# Patient Record
Sex: Male | Born: 1990 | Race: White | Hispanic: No | Marital: Married | Smoking: Never smoker
Health system: Southern US, Community
[De-identification: ages and names within clinical notes are randomized; demographics above are authoritative.]

## PROBLEM LIST (undated history)

## (undated) DIAGNOSIS — J45909 Unspecified asthma, uncomplicated: Secondary | ICD-10-CM

## (undated) DIAGNOSIS — K219 Gastro-esophageal reflux disease without esophagitis: Secondary | ICD-10-CM

## (undated) HISTORY — DX: Gastro-esophageal reflux disease without esophagitis: K21.9

## (undated) HISTORY — DX: Unspecified asthma, uncomplicated: J45.909

---

## 2019-04-28 ENCOUNTER — Ambulatory Visit (INDEPENDENT_AMBULATORY_CARE_PROVIDER_SITE_OTHER): Payer: 59

## 2019-04-28 ENCOUNTER — Other Ambulatory Visit: Payer: Self-pay

## 2019-04-28 ENCOUNTER — Ambulatory Visit: Payer: 59 | Admitting: Family Medicine

## 2019-04-28 ENCOUNTER — Encounter: Payer: Self-pay | Admitting: Family Medicine

## 2019-04-28 VITALS — BP 116/69 | HR 68 | Temp 98.7°F | Resp 16 | Wt 133.2 lb

## 2019-04-28 DIAGNOSIS — Z13220 Encounter for screening for lipoid disorders: Secondary | ICD-10-CM | POA: Diagnosis not present

## 2019-04-28 DIAGNOSIS — M25521 Pain in right elbow: Secondary | ICD-10-CM

## 2019-04-28 DIAGNOSIS — Z131 Encounter for screening for diabetes mellitus: Secondary | ICD-10-CM | POA: Diagnosis not present

## 2019-04-28 DIAGNOSIS — Z114 Encounter for screening for human immunodeficiency virus [HIV]: Secondary | ICD-10-CM

## 2019-04-28 DIAGNOSIS — Z1329 Encounter for screening for other suspected endocrine disorder: Secondary | ICD-10-CM

## 2019-04-28 DIAGNOSIS — R12 Heartburn: Secondary | ICD-10-CM

## 2019-04-28 DIAGNOSIS — Z130 Encounter for screening for diseases of the blood and blood-forming organs and certain disorders involving the immune mechanism: Secondary | ICD-10-CM

## 2019-04-28 DIAGNOSIS — S5001XA Contusion of right elbow, initial encounter: Secondary | ICD-10-CM | POA: Diagnosis not present

## 2019-04-28 NOTE — Patient Instructions (Addendum)
Try to avoid trigger foods for heartburn.  If daily meds needed, follow up to discuss further.   Good news.  There is no sign of fracture or concerns on x-ray of your elbow.  I suspect it was a contusion and should continue to improve.  If any worsening symptoms return for recheck.  I did check some screening labs as we discussed and if there are abnormalities can follow-up to discuss further.  Thank you for coming in today and let me know if there are questions.      Food Choices for Gastroesophageal Reflux Disease, Adult When you have gastroesophageal reflux disease (GERD), the foods you eat and your eating habits are very important. Choosing the right foods can help ease your discomfort. Think about working with a nutrition specialist (dietitian) to help you make good choices. What are tips for following this plan?  Meals  Choose healthy foods that are low in fat, such as fruits, vegetables, whole grains, low-fat dairy products, and lean meat, fish, and poultry.  Eat small meals often instead of 3 large meals a day. Eat your meals slowly, and in a place where you are relaxed. Avoid bending over or lying down until 2-3 hours after eating.  Avoid eating meals 2-3 hours before bed.  Avoid drinking a lot of liquid with meals.  Cook foods using methods other than frying. Bake, grill, or broil food instead.  Avoid or limit: ? Chocolate. ? Peppermint or spearmint. ? Alcohol. ? Pepper. ? Black and decaffeinated coffee. ? Black and decaffeinated tea. ? Bubbly (carbonated) soft drinks. ? Caffeinated energy drinks and soft drinks.  Limit high-fat foods such as: ? Fatty meat or fried foods. ? Whole milk, cream, butter, or ice cream. ? Nuts and nut butters. ? Pastries, donuts, and sweets made with butter or shortening.  Avoid foods that cause symptoms. These foods may be different for everyone. Common foods that cause symptoms include: ? Tomatoes. ? Oranges, lemons, and  limes. ? Peppers. ? Spicy food. ? Onions and garlic. ? Vinegar. Lifestyle  Maintain a healthy weight. Ask your doctor what weight is healthy for you. If you need to lose weight, work with your doctor to do so safely.  Exercise for at least 30 minutes for 5 or more days each week, or as told by your doctor.  Wear loose-fitting clothes.  Do not smoke. If you need help quitting, ask your doctor.  Sleep with the head of your bed higher than your feet. Use a wedge under the mattress or blocks under the bed frame to raise the head of the bed. Summary  When you have gastroesophageal reflux disease (GERD), food and lifestyle choices are very important in easing your symptoms.  Eat small meals often instead of 3 large meals a day. Eat your meals slowly, and in a place where you are relaxed.  Limit high-fat foods such as fatty meat or fried foods.  Avoid bending over or lying down until 2-3 hours after eating.  Avoid peppermint and spearmint, caffeine, alcohol, and chocolate. This information is not intended to replace advice given to you by your health care provider. Make sure you discuss any questions you have with your health care provider. Document Released: 02/25/2012 Document Revised: 12/17/2018 Document Reviewed: 10/01/2016 Elsevier Patient Education  [employer].    If you have lab work done today you will be contacted with your lab results within the next 2 weeks.  If you have not heard from [place] then  please contact us. The fastest way to get your results is to register for My Chart.   IF you received an x-ray today, you will receive an invoice from [facility]. Please contact [facility] at [phone] with questions or concerns regarding your invoice.   IF you received labwork today, you will receive an invoice from [facility]. Please contact [facility] at [phone] with questions or concerns regarding your invoice.   Our billing staff will not be able  to assist you with questions regarding bills from these companies.  You will be contacted with the lab results as soon as they are available. The fastest way to get your results is to activate your My Chart account. Instructions are located on the last page of this paperwork. If you have not heard from us regarding the results in 2 weeks, please contact this office.
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
## 2019-04-29 LAB — HIV ANTIBODY (ROUTINE TESTING W REFLEX): HIV Screen 4th Generation wRfx: NONREACTIVE

## 2019-04-29 LAB — CBC WITH DIFFERENTIAL/PLATELET
Basophils Absolute: 0.1 10*3/uL (ref 0.0–0.2)
Basos: 1 %
EOS (ABSOLUTE): 0.2 10*3/uL (ref 0.0–0.4)
Eos: 2 %
Hematocrit: 41.8 % (ref 37.5–51.0)
Hemoglobin: 14.2 g/dL (ref 13.0–17.7)
Immature Grans (Abs): 0 10*3/uL (ref 0.0–0.1)
Immature Granulocytes: 0 %
Lymphocytes Absolute: 2.8 10*3/uL (ref 0.7–3.1)
Lymphs: 35 %
MCH: 31.9 pg (ref 26.6–33.0)
MCHC: 34 g/dL (ref 31.5–35.7)
MCV: 94 fL (ref 79–97)
Monocytes Absolute: 0.6 10*3/uL (ref 0.1–0.9)
Monocytes: 7 %
Neutrophils Absolute: 4.4 10*3/uL (ref 1.4–7.0)
Neutrophils: 55 %
Platelets: 212 10*3/uL (ref 150–450)
RBC: 4.45 x10E6/uL (ref 4.14–5.80)
RDW: 12.3 % (ref 11.6–15.4)
WBC: 7.9 10*3/uL (ref 3.4–10.8)

## 2019-04-29 LAB — COMPREHENSIVE METABOLIC PANEL
ALT: 26 IU/L (ref 0–44)
AST: 35 IU/L (ref 0–40)
Albumin/Globulin Ratio: 2.8 — ABNORMAL HIGH (ref 1.2–2.2)
Albumin: 5.3 g/dL — ABNORMAL HIGH (ref 4.1–5.2)
Alkaline Phosphatase: 64 IU/L (ref 39–117)
BUN/Creatinine Ratio: 40 — ABNORMAL HIGH (ref 9–20)
BUN: 34 mg/dL — ABNORMAL HIGH (ref 6–20)
Bilirubin Total: 0.4 mg/dL (ref 0.0–1.2)
CO2: 24 mmol/L (ref 20–29)
Calcium: 10 mg/dL (ref 8.7–10.2)
Chloride: 99 mmol/L (ref 96–106)
Creatinine, Ser: 0.84 mg/dL (ref 0.76–1.27)
GFR calc Af Amer: 139 mL/min/{1.73_m2} (ref >59)
GFR calc non Af Amer: 120 mL/min/{1.73_m2} (ref >59)
Globulin, Total: 1.9 g/dL (ref 1.5–4.5)
Glucose: 68 mg/dL (ref 65–99)
Potassium: 4.5 mmol/L (ref 3.5–5.2)
Sodium: 139 mmol/L (ref 134–144)
Total Protein: 7.2 g/dL (ref 6.0–8.5)

## 2019-04-29 LAB — LIPID PANEL
Chol/HDL Ratio: 2.7 ratio (ref 0.0–5.0)
Cholesterol, Total: 157 mg/dL (ref 100–199)
HDL: 58 mg/dL (ref >39)
LDL Calculated: 85 mg/dL (ref 0–99)
Triglycerides: 69 mg/dL (ref 0–149)
VLDL Cholesterol Cal: 14 mg/dL (ref 5–40)

## 2019-04-29 LAB — TSH: TSH: 2.4 u[IU]/mL (ref 0.450–4.500)

## 2019-05-18 ENCOUNTER — Encounter: Payer: Self-pay | Admitting: Radiology

## 2020-04-23 IMAGING — DX RIGHT ELBOW - COMPLETE 3+ VIEW
4 series · 4 of 4 positions shown · non-contrast
Comparison: None.

CLINICAL DATA: Fall, elbow pain

EXAM:
RIGHT ELBOW - COMPLETE 3+ VIEW

[elbow ap]
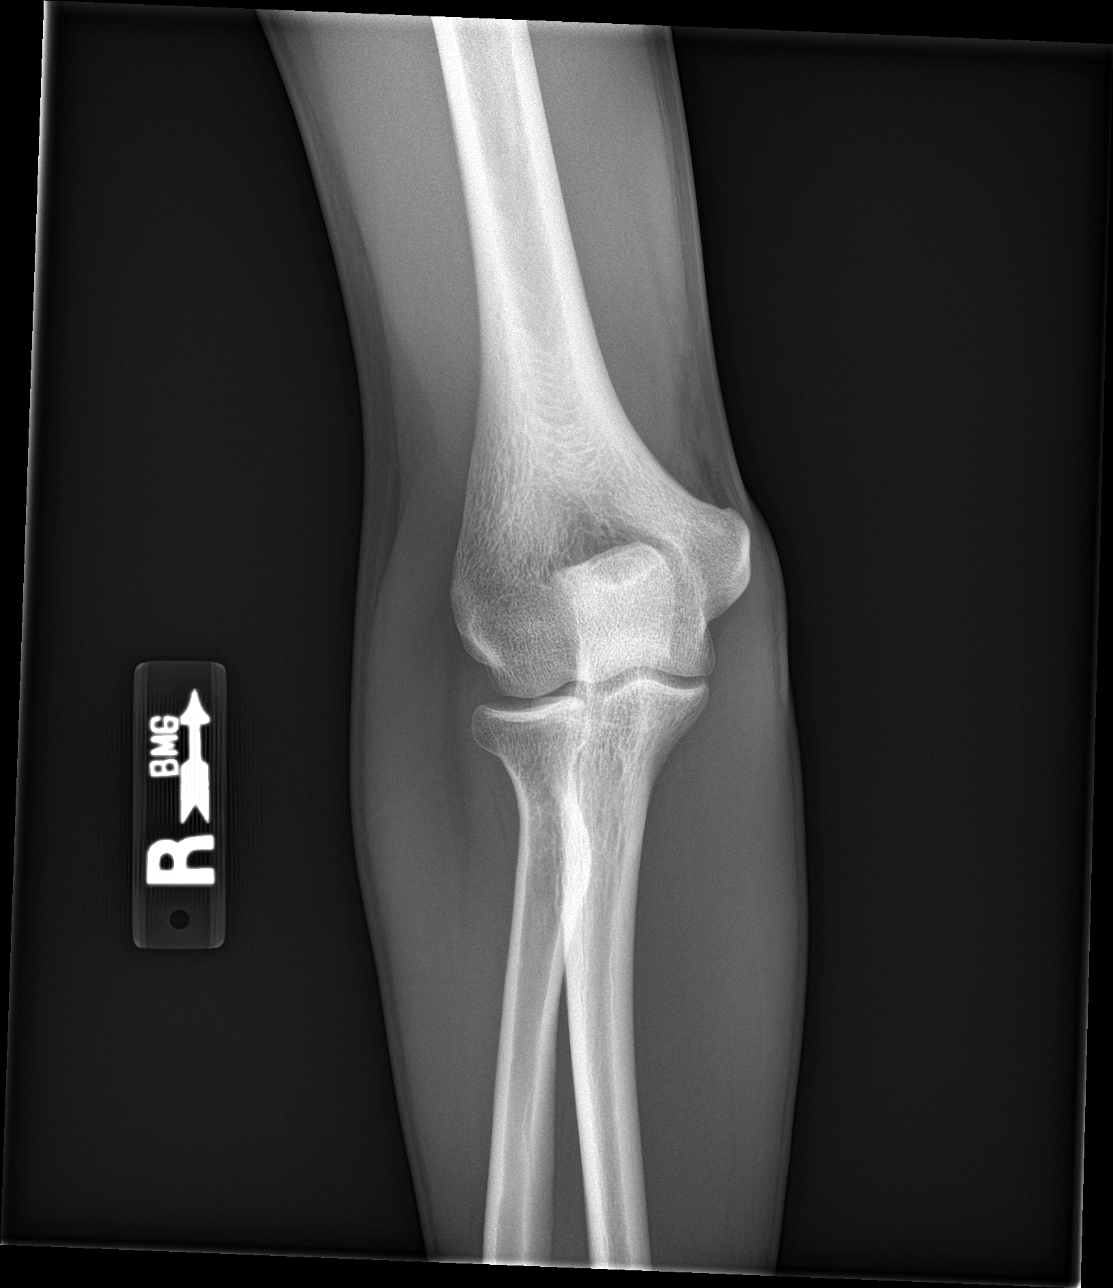

[elbow obl (1 of 2)]
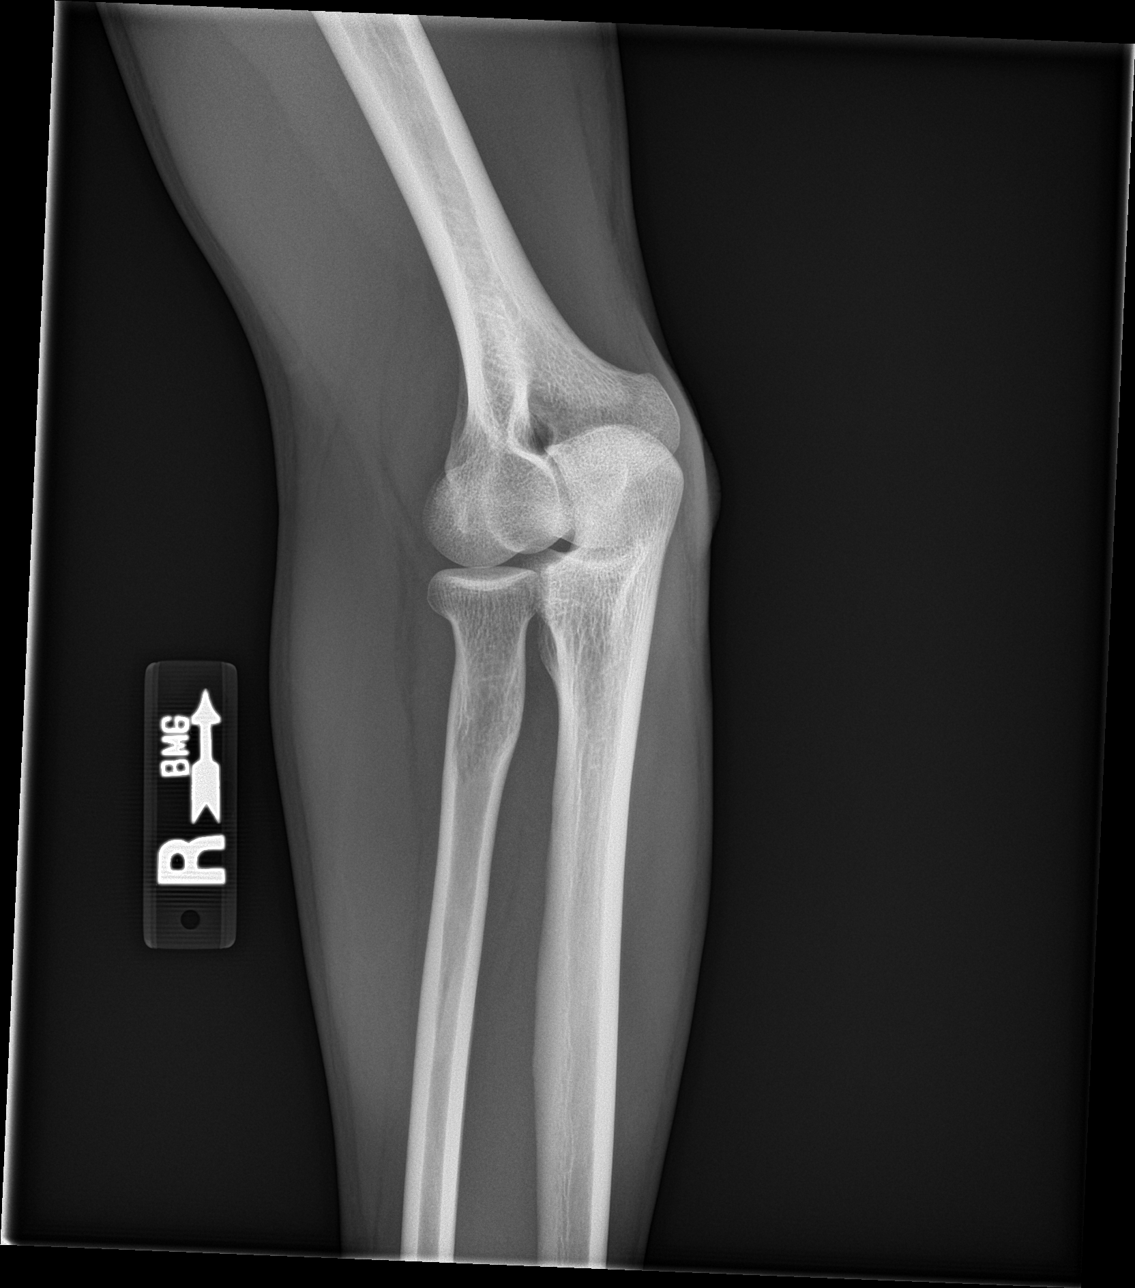

[elbow obl (2 of 2)]
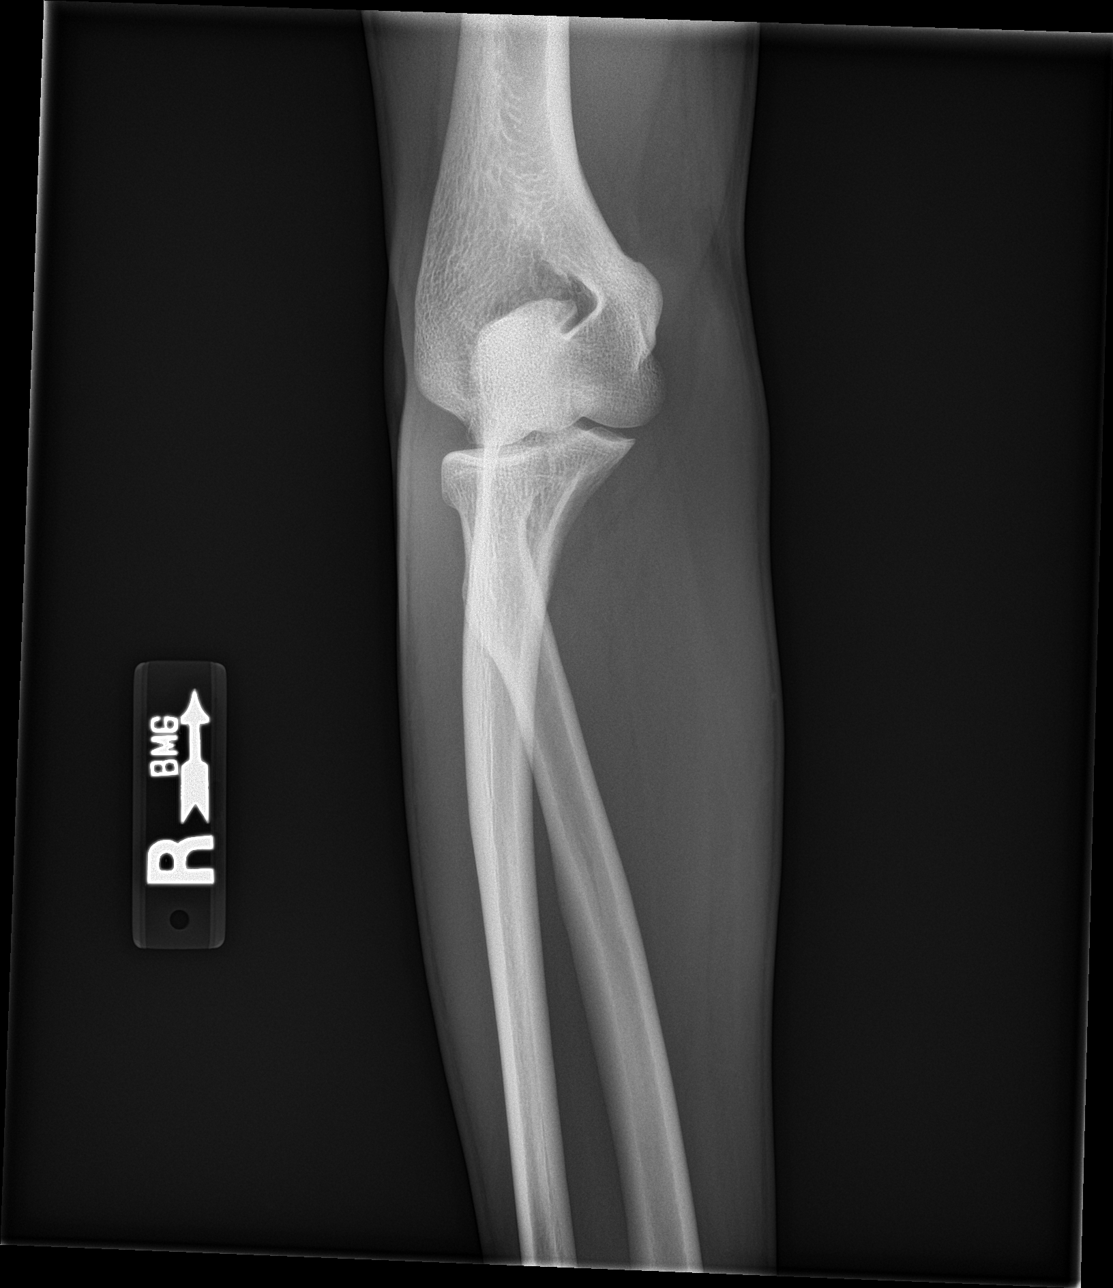

[elbow lat]
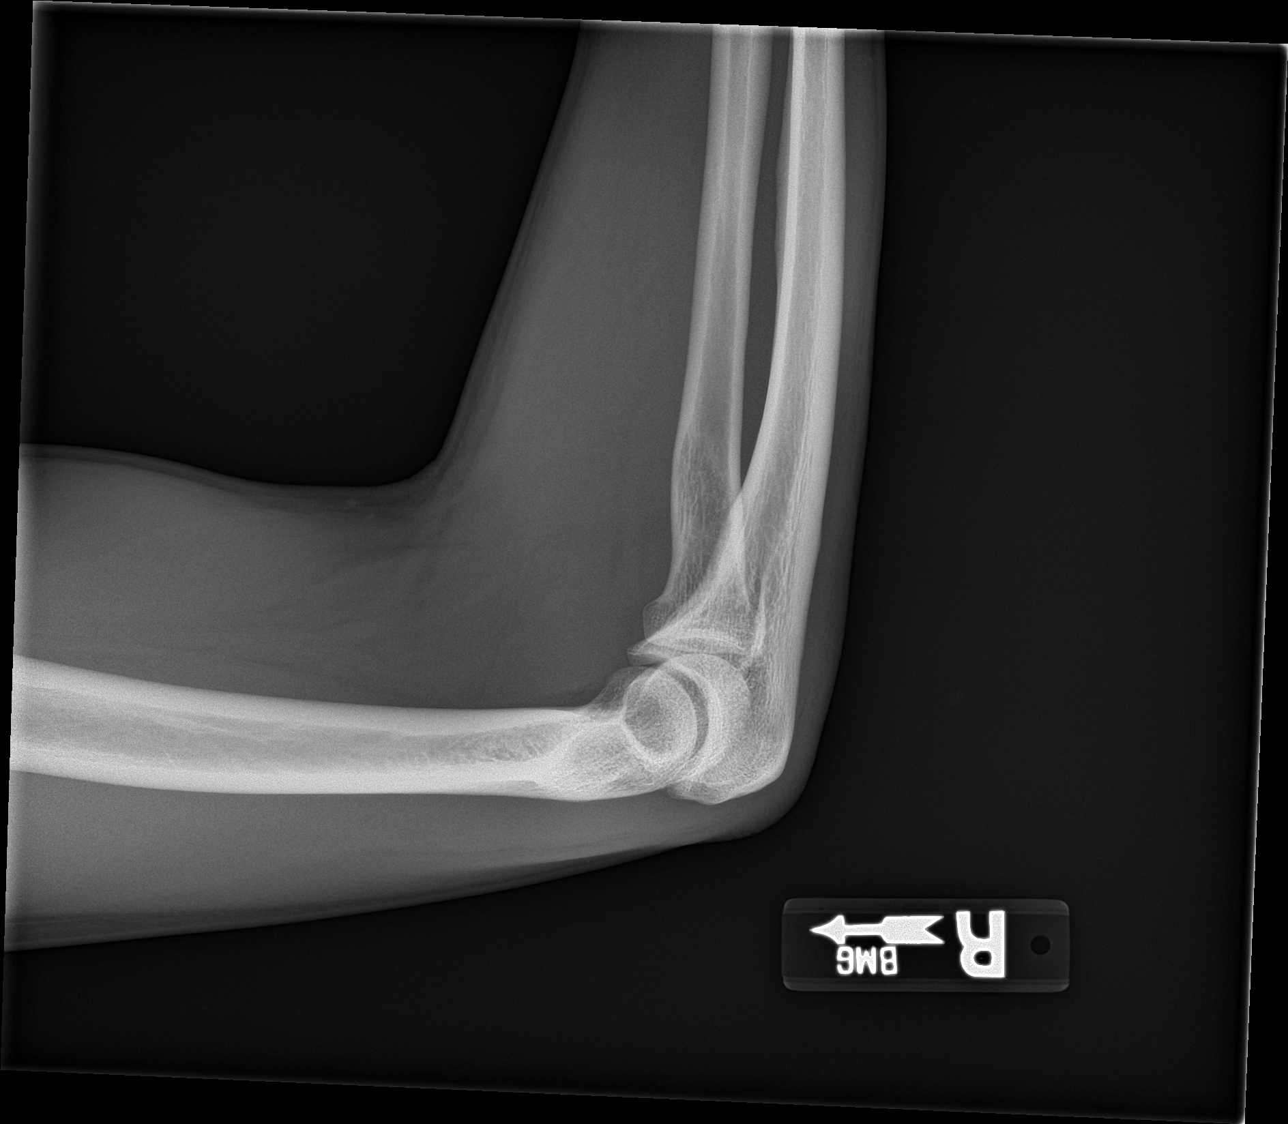

[4 of 4 positions shown; findings below may reference images not displayed]

FINDINGS: There is no evidence of fracture, dislocation, or joint effusion.
There is no evidence of arthropathy or other focal bone abnormality.
Soft tissues are unremarkable.
IMPRESSION: Negative.
